# Patient Record
Sex: Male | Born: 1990 | Race: White | Hispanic: No | Marital: Married | Smoking: Current some day smoker
Health system: Southern US, Community
[De-identification: ages and names within clinical notes are randomized; demographics above are authoritative.]

---
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
## 2012-03-11 IMAGING — CT CT HEAD WITHOUT CONTRAST
2 series · 16 of 30 positions shown, 20 images · non-contrast
Comparison: none

REASON FOR EXAM: fall dizzy
COMMENTS:

[Series 2: without · axial · non-contrast · 0.43mm/px · z∈[-146,-26]mm · 13 of 30 slices shown, 17 images]
[im 3/30  brain]
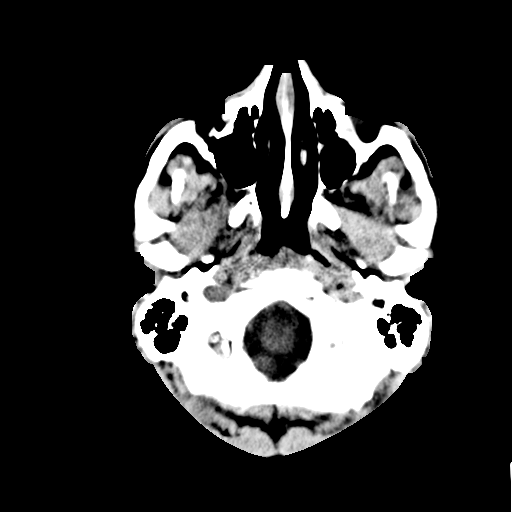
[im 3/30  bone]
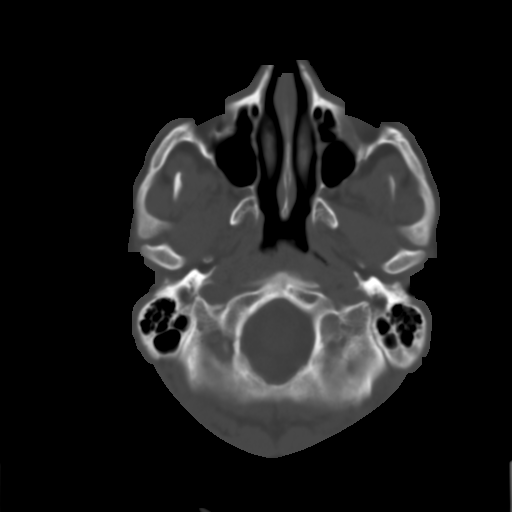
[im 5/30  brain]
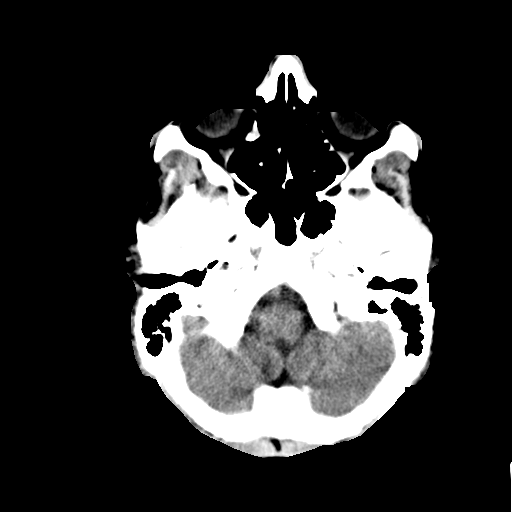
[im 7/30  brain]
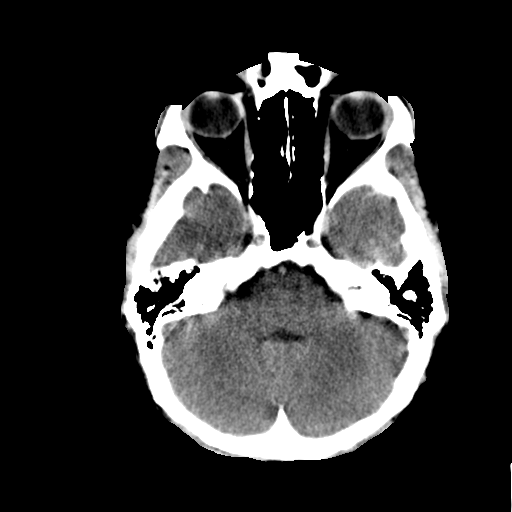
[im 9/30  brain]
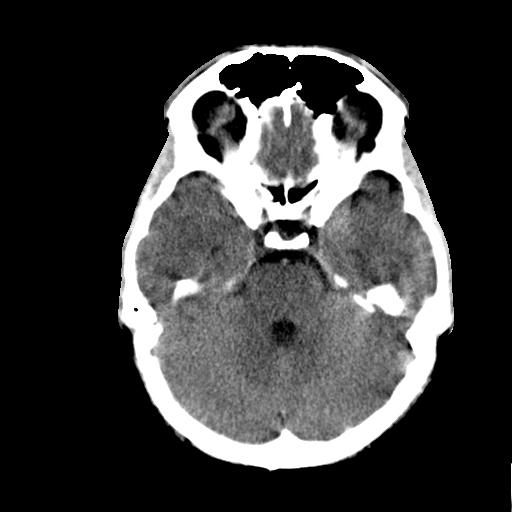
[im 11/30  brain]
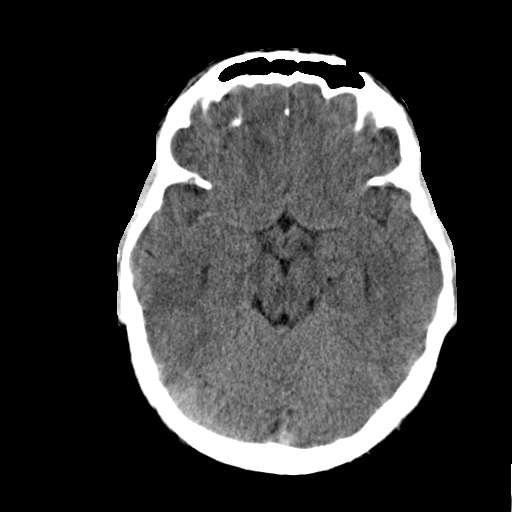
[im 11/30  bone]
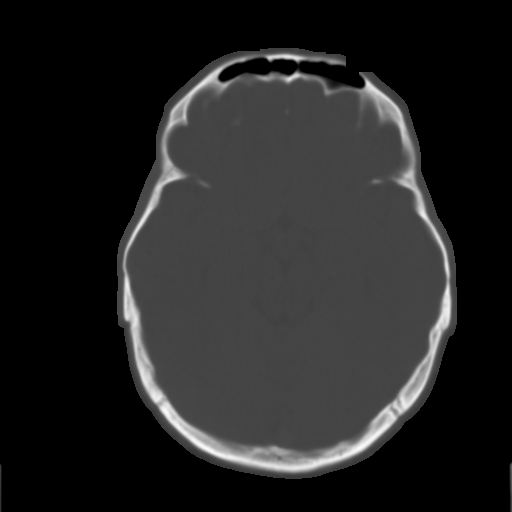
[im 13/30  brain]
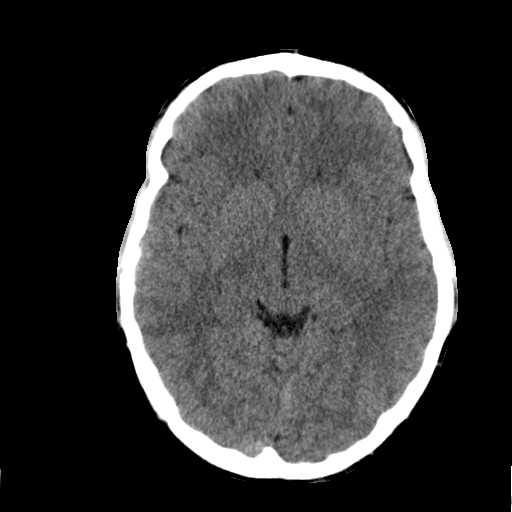
[im 15/30  brain]
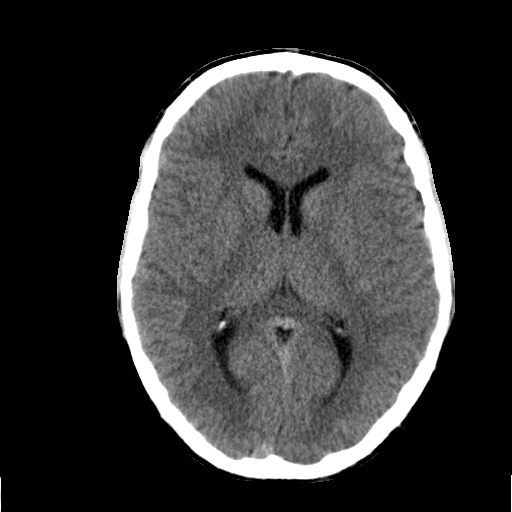
[im 17/30  brain]
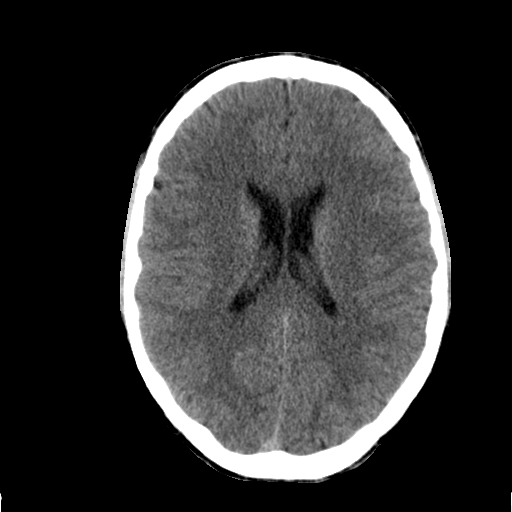
[im 19/30  brain]
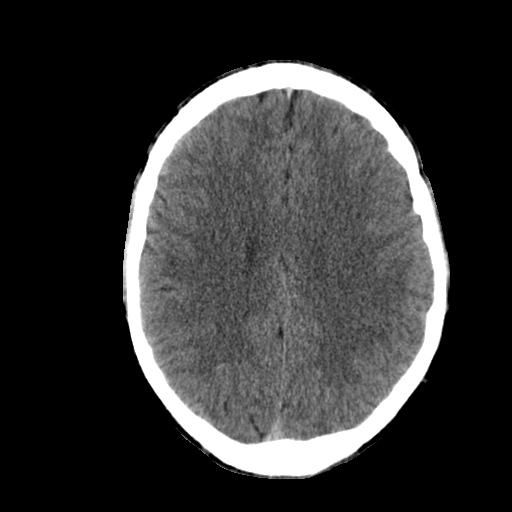
[im 19/30  bone]
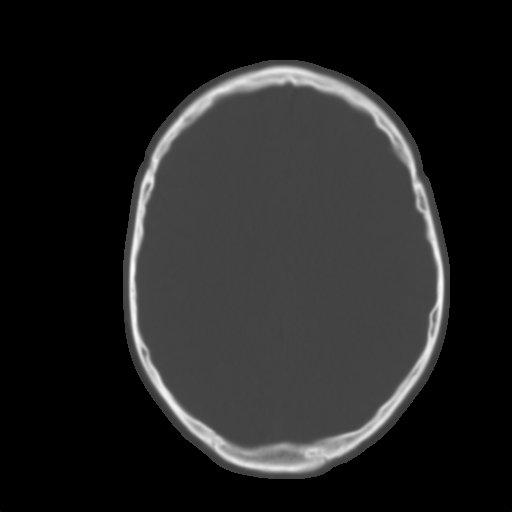
[im 21/30  brain]
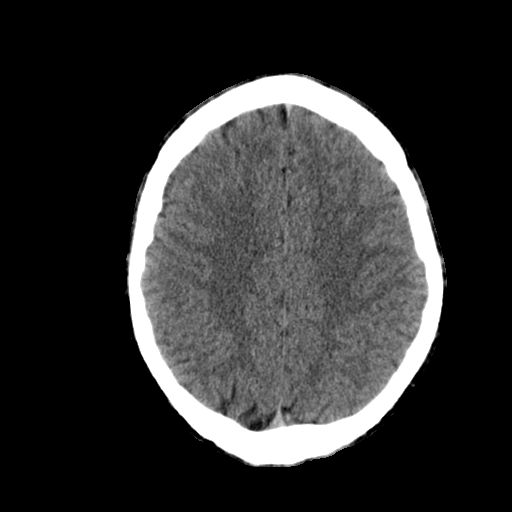
[im 23/30  brain]
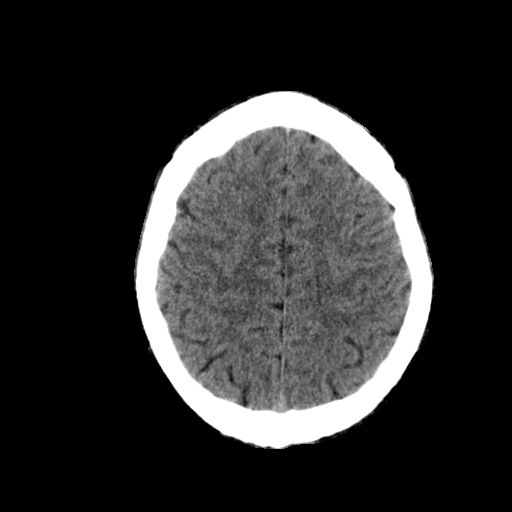
[im 25/30  brain]
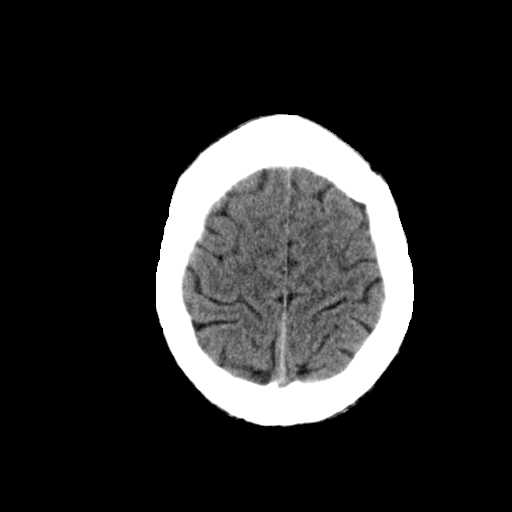
[im 27/30  brain]
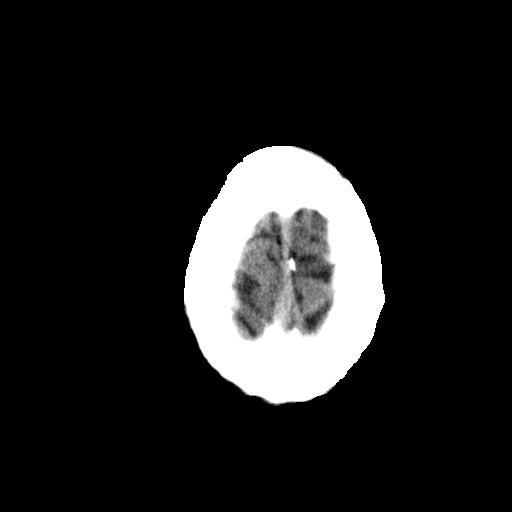
[im 27/30  bone]
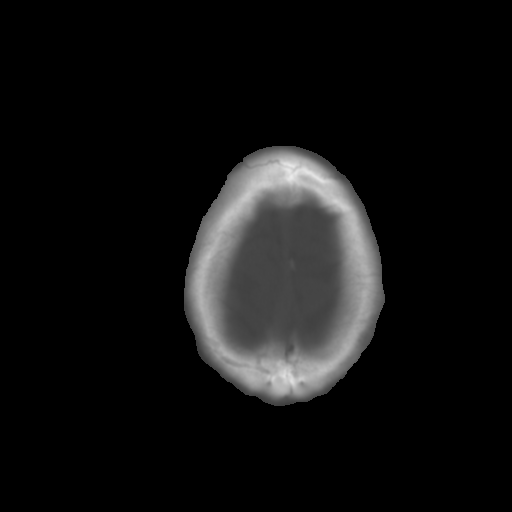

[Series 3: bone · axial · 0.43mm/px · z∈[-146,-106]mm · 3 of 30 slices shown]
[im 3/30  bone]
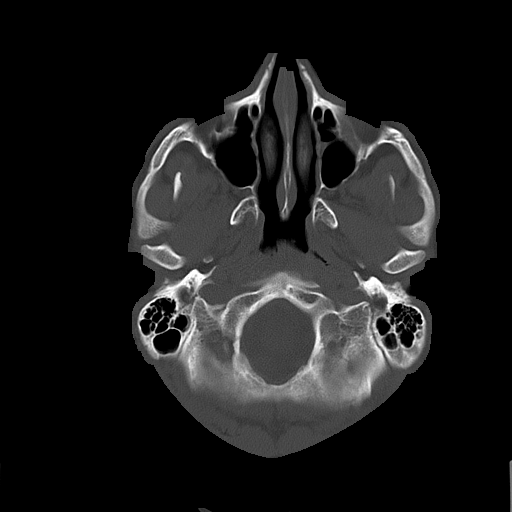
[im 7/30  bone]
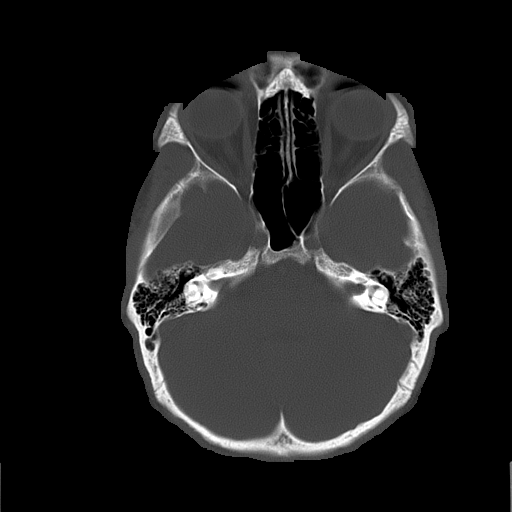
[im 11/30  bone]
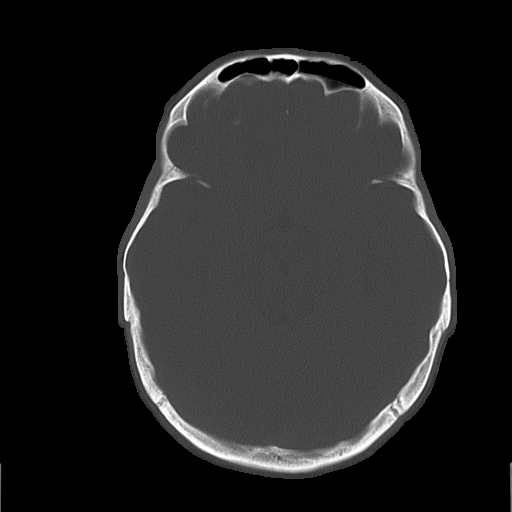

[16 of 30 positions shown; findings below may reference images not displayed]

PROCEDURE:     CT  - CT HEAD WITHOUT CONTRAST  - December 07, 2010  [DATE]

RESULT:     Axial noncontrast CT scanning was performed through the brain at
5 mm intervals and slice thicknesses.

The ventricles are normal in size and position. There is no intracranial
hemorrhage nor intracranial mass effect. There is no evidence of an evolving
ischemic infarction. The cerebellum and brainstem are normal in density. At
bone window settings the observed portions of the paranasal sinuses and
mastoid air cells are clear. There is no evidence of an acute skull fracture.
IMPRESSION: I see no acute intracranial abnormality.

## 2012-03-25 ENCOUNTER — Emergency Department: Payer: Self-pay | Admitting: Emergency Medicine

## 2014-10-19 ENCOUNTER — Encounter: Payer: Self-pay | Admitting: *Deleted

## 2014-10-19 ENCOUNTER — Emergency Department
Admission: EM | Admit: 2014-10-19 | Discharge: 2014-10-19 | Discharge status: Against Medical Advice | Payer: Self-pay | Attending: Emergency Medicine | Admitting: Emergency Medicine

## 2014-10-19 ENCOUNTER — Other Ambulatory Visit: Payer: Self-pay

## 2014-10-19 DIAGNOSIS — Z720 Tobacco use: Secondary | ICD-10-CM | POA: Insufficient documentation

## 2014-10-19 DIAGNOSIS — R0789 Other chest pain: Secondary | ICD-10-CM | POA: Insufficient documentation

## 2014-10-19 LAB — BASIC METABOLIC PANEL
ANION GAP: 10 (ref 5–15)
BUN: 19 mg/dL (ref 6–20)
CHLORIDE: 101 mmol/L (ref 101–111)
CO2: 27 mmol/L (ref 22–32)
CREATININE: 1.17 mg/dL (ref 0.61–1.24)
Calcium: 9.6 mg/dL (ref 8.9–10.3)
GFR calc Af Amer: >60 mL/min (ref >60)
GFR calc non Af Amer: >60 mL/min (ref >60)
Glucose, Bld: 97 mg/dL (ref 65–99)
Potassium: 4.2 mmol/L (ref 3.5–5.1)
Sodium: 138 mmol/L (ref 135–145)

## 2014-10-19 LAB — CBC
HCT: 50 % (ref 40.0–52.0)
Hemoglobin: 16.3 g/dL (ref 13.0–18.0)
MCH: 27.6 pg (ref 26.0–34.0)
MCHC: 32.6 g/dL (ref 32.0–36.0)
MCV: 84.7 fL (ref 80.0–100.0)
Platelets: 214 10*3/uL (ref 150–440)
RBC: 5.91 MIL/uL — ABNORMAL HIGH (ref 4.40–5.90)
RDW: 14.2 % (ref 11.5–14.5)
WBC: 9.1 10*3/uL (ref 3.8–10.6)

## 2014-10-19 LAB — TROPONIN I: Troponin I: <0.03 ng/mL (ref <0.031)

## 2014-10-19 NOTE — ED Notes (Signed)
Pt reports central chest pain for the last week , pt reports not eating much

## 2014-10-20 ENCOUNTER — Telehealth: Payer: Self-pay | Admitting: Emergency Medicine

## 2014-10-20 NOTE — ED Notes (Signed)
Called patient due to lwot to inquire about condition and follow up plans. Pt says he left here and went to [facility] and was evaluated there.

## 2019-07-27 ENCOUNTER — Other Ambulatory Visit: Payer: Self-pay

## 2019-07-27 ENCOUNTER — Emergency Department
Admission: EM | Admit: 2019-07-27 | Discharge: 2019-07-27 | Disposition: A | Discharge status: Home / Self Care | Payer: Self-pay | Attending: Emergency Medicine | Admitting: Emergency Medicine

## 2019-07-27 DIAGNOSIS — Z202 Contact with and (suspected) exposure to infections with a predominantly sexual mode of transmission: Secondary | ICD-10-CM | POA: Insufficient documentation

## 2019-07-27 DIAGNOSIS — F17290 Nicotine dependence, other tobacco product, uncomplicated: Secondary | ICD-10-CM | POA: Insufficient documentation

## 2019-07-27 MED ORDER — AZITHROMYCIN 500 MG PO TABS: ORAL_TABLET | ORAL | 0 refills | Status: AC

## 2019-07-27 MED ORDER — AZITHROMYCIN 500 MG PO TABS: ORAL_TABLET | ORAL | 0 refills | Status: DC

## 2019-07-27 NOTE — ED Notes (Signed)
See triage note  Presents for treatment for STD  States wife was tested positive for Chlamydia  Denies any sx's

## 2019-07-27 NOTE — Discharge Instructions (Signed)
Pick up your medication at [facility] on [address] and take both tablets at the same time.  Make sure that you eat prior to taking the medication as it could cause some stomach upset.

## 2019-07-27 NOTE — ED Provider Notes (Signed)
[facility] Emergency Department Provider Note   ____________________________________________   First MD Initiated Contact with Patient 07/27/19 0815     (approximate)  I have reviewed the triage vital signs and the nursing notes.   HISTORY  Chief Complaint Exposure to STD   HPI Jose Hudson is a 29 y.o. male presents to the ED for treatment of an STD.  Patient is wife was seen in the ED yesterday by myself and tested positive for chlamydia.  Patient states that he has had no discharge or dysuria.      History reviewed. No pertinent past medical history.  There are no problems to display for this patient.   History reviewed. No pertinent surgical history.  Prior to Admission medications   Medication Sig Start Date End Date Taking? Authorizing Provider  azithromycin (ZITHROMAX) 500 MG tablet Take 2 tablets once 07/27/19 08/01/19  Tommi Rumps, PA-C    Allergies Patient has no known allergies.  No family history on file.  Social History Social History   Tobacco Use  . Smoking status: Current Some Day Smoker  . Smokeless tobacco: Current User  Substance Use Topics  . Alcohol use: Yes  . Drug use: Not on file    Review of Systems Constitutional: No fever/chills Eyes: No visual changes. ENT: No sore throat. Cardiovascular: Denies chest pain. Respiratory: Denies shortness of breath. Gastrointestinal: No abdominal pain.  No nausea, no vomiting.  Genitourinary: Negative for dysuria.  Negative for penile discharge. Musculoskeletal: Negative for muscle aches. Skin: Negative for rash. Neurological: Negative for headaches, focal weakness or numbness. ___________________________________________   PHYSICAL EXAM:  VITAL SIGNS: ED Triage Vitals  Enc Vitals Group     BP 07/27/19 0735 138/85     Pulse Rate 07/27/19 0735 80     Resp 07/27/19 0735 18     Temp 07/27/19 0735 97.9 F (36.6 C)     Temp Source 07/27/19 0735 Oral     SpO2  07/27/19 0735 99 %     Weight 07/27/19 0735 180 lb (81.6 kg)     Height 07/27/19 0735 5\' 8"  (1.727 m)     Head Circumference --      Peak Flow --      Pain Score 07/27/19 0757 0     Pain Loc --      Pain Edu? --      Excl. in GC? --    Constitutional: Alert and oriented. Well appearing and in no acute distress. Eyes: Conjunctivae are normal.  Head: Atraumatic. Neck: No stridor.   Cardiovascular: Normal rate, regular rhythm. Grossly normal heart sounds.  Good peripheral circulation. Respiratory: Normal respiratory effort.  No retractions. Lungs CTAB. Musculoskeletal: Moves upper and lower extremities with any difficulty.  Normal gait was noted. Neurologic:  Normal speech and language. No gross focal neurologic deficits are appreciated. No gait instability. Skin:  Skin is warm, dry and intact.  Psychiatric: Mood and affect are normal. Speech and behavior are normal.  ____________________________________________   LABS (all labs ordered are listed, but only abnormal results are displayed)  Labs Reviewed - No data to display   PROCEDURES  Procedure(s) performed (including Critical Care):  Procedures   ____________________________________________   INITIAL IMPRESSION / ASSESSMENT AND PLAN / ED COURSE  As part of my medical decision making, I reviewed the following data within the electronic MEDICAL RECORD NUMBER Notes from prior ED visits and [place] Controlled Substance Database   29 year old male presents to the  ED for treatment of chlamydia as his wife was seen yesterday and her test came back positive.  Patient denies any penile discharge or dysuria.  Patient was given a prescription for Zithromax 500 mg 2 tablets 1 dose.  He was also made aware if there is any continued problems that he can be seen at the health department.  ____________________________________________   FINAL CLINICAL IMPRESSION(S) / ED DIAGNOSES  Final diagnoses:  Exposure to chlamydia     ED Discharge  Orders         Ordered    azithromycin (ZITHROMAX) 500 MG tablet  Status:  Discontinued     07/27/19 0835    azithromycin (ZITHROMAX) 500 MG tablet    Note to Pharmacy: Disregard initial prescription as the instructions are incorrect.   07/27/19 0836           Note:  This document was prepared using Dragon voice recognition software and may include unintentional dictation errors.    Johnn Hai, PA-C 07/27/19 0037    Earleen Newport, MD 07/27/19 [phone]

## 2019-07-27 NOTE — ED Triage Notes (Signed)
Pt states his wife tested positive for chlamydia yesterday and needs to be tested. Pt denies any sx or penile discharge.

## 2020-09-12 ENCOUNTER — Other Ambulatory Visit: Payer: Self-pay

## 2020-09-12 ENCOUNTER — Emergency Department
Admission: EM | Admit: 2020-09-12 | Discharge: 2020-09-12 | Disposition: A | Discharge status: Home / Self Care | Payer: Self-pay | Attending: Emergency Medicine | Admitting: Emergency Medicine

## 2020-09-12 DIAGNOSIS — Y990 Civilian activity done for income or pay: Secondary | ICD-10-CM | POA: Insufficient documentation

## 2020-09-12 DIAGNOSIS — X500XXA Overexertion from strenuous movement or load, initial encounter: Secondary | ICD-10-CM | POA: Insufficient documentation

## 2020-09-12 DIAGNOSIS — M5412 Radiculopathy, cervical region: Secondary | ICD-10-CM | POA: Insufficient documentation

## 2020-09-12 DIAGNOSIS — S39012A Strain of muscle, fascia and tendon of lower back, initial encounter: Secondary | ICD-10-CM | POA: Insufficient documentation

## 2020-09-12 DIAGNOSIS — F17200 Nicotine dependence, unspecified, uncomplicated: Secondary | ICD-10-CM | POA: Insufficient documentation

## 2020-09-12 MED ORDER — MELOXICAM 7.5 MG PO TABS
15.0000 mg | ORAL_TABLET | Freq: Once | ORAL | Status: AC
  Administered 2020-09-12: 15 mg via ORAL
  Filled 2020-09-12: qty 2

## 2020-09-12 MED ORDER — METHOCARBAMOL 500 MG PO TABS
1000.0000 mg | ORAL_TABLET | Freq: Once | ORAL | Status: AC
  Administered 2020-09-12: 1000 mg via ORAL
  Filled 2020-09-12: qty 2

## 2020-09-12 MED ORDER — METHOCARBAMOL 500 MG PO TABS: 500.0000 mg | ORAL_TABLET | Freq: Four times a day (QID) | ORAL | 0 refills | Status: AC

## 2020-09-12 MED ORDER — MELOXICAM 15 MG PO TABS: 15.0000 mg | ORAL_TABLET | Freq: Every day | ORAL | 0 refills | Status: AC

## 2020-09-12 NOTE — ED Provider Notes (Signed)
[facility] Emergency Department Provider Note  ____________________________________________  Time seen: Approximately 8:50 PM  I have reviewed the triage vital signs and the nursing notes.   HISTORY  Chief Complaint Headache, Back Pain, and Shoulder Pain    HPI Jose Hudson is a 30 y.o. male who presented to the emergency department complaining of lower back pain times several days as well as some pain in his neck rating down his arm earlier today.  Patient states that for several days has had some lower back pain.  He does heavy lifting and repetitive bending and twisting at his work.  Today patient was loading a forklift, states that after doing so he was walking to his truck and as he pulled on his truck door he had a numb/shooting sensation down his arm.  He states that this has resolved.  He had had a headache at the same time that he had the sensation in his arm but that has also resolved.  Patient is still complaining of some lower back pain at this time.  No radiation into the legs.  He has had no bowel or bladder dysfunction, saddle anesthesia or paresthesias.  No chronic issues with his neck or his back.  He not take any medications for his complaint prior to arrival.         History reviewed. No pertinent past medical history.  There are no problems to display for this patient.   History reviewed. No pertinent surgical history.  Prior to Admission medications   Medication Sig Start Date End Date Taking? Authorizing Provider  meloxicam (MOBIC) 15 MG tablet Take 1 tablet (15 mg total) by mouth daily. 09/12/20  Yes Daenerys Buttram, Delorise Royals, PA-C  methocarbamol (ROBAXIN) 500 MG tablet Take 1 tablet (500 mg total) by mouth 4 (four) times daily. 09/12/20  Yes Haili Donofrio, Delorise Royals, PA-C    Allergies Patient has no known allergies.  No family history on file.  Social History Social History   Tobacco Use  . Smoking status: Current Some Day Smoker   . Smokeless tobacco: Current User  Substance Use Topics  . Alcohol use: Yes    Alcohol/week: 2.0 standard drinks    Types: 2 Shots of liquor per week    Comment: most nights  . Drug use: Never     Review of Systems  Constitutional: No fever/chills Eyes: No visual changes. No discharge ENT: No upper respiratory complaints. Cardiovascular: no chest pain. Respiratory: no cough. No SOB. Gastrointestinal: No abdominal pain.  No nausea, no vomiting.  No diarrhea.  No constipation. Genitourinary: Negative for dysuria. No hematuria Musculoskeletal: Positive for lower back pain.  Patient with positive neck pain running down the arm with some numbness and tingling. Skin: Negative for rash, abrasions, lacerations, ecchymosis. Neurological: Negative for headaches, focal weakness or numbness.  10 System ROS otherwise negative.  ____________________________________________   PHYSICAL EXAM:  VITAL SIGNS: ED Triage Vitals  Enc Vitals Group     BP 09/12/20 1827 139/83     Pulse Rate 09/12/20 1827 69     Resp 09/12/20 1827 18     Temp 09/12/20 1827 98.2 F (36.8 C)     Temp Source 09/12/20 1827 Oral     SpO2 09/12/20 1827 98 %     Weight --      Height --      Head Circumference --      Peak Flow --      Pain Score 09/12/20 1825 8  Pain Loc --      Pain Edu? --      Excl. in GC? --      Constitutional: Alert and oriented. Well appearing and in no acute distress. Eyes: Conjunctivae are normal. PERRL. EOMI. Head: Atraumatic. ENT:      Ears:       Nose: No congestion/rhinnorhea.      Mouth/Throat: Mucous membranes are moist.  Neck: No stridor.  No cervical spine tenderness to palpation.  Midline the left side.  Mild tenderness in the right paraspinal muscle group extending along the superior aspect of the shoulder.  No palpable abnormalities.  Radial pulses sensation intact and equal bilateral upper extremities.  Cardiovascular: Normal rate, regular rhythm. Normal S1 and S2.   Good peripheral circulation. Respiratory: Normal respiratory effort without tachypnea or retractions. Lungs CTAB. Good air entry to the bases with no decreased or absent breath sounds. Musculoskeletal: Full range of motion to all extremities. No gross deformities appreciated.  Visualization of lumbar spine reveals no visible signs of trauma.  Nontender to palpation midline and left paraspinal muscle group but diffuse tenderness along the right paraspinal muscle group extending from the lower thoracic region and through the lumbar spine.  Palpable spasm on the right side as well.  No extension into the SI joint or sciatic notch.  Negative straight leg raise bilaterally.  Dorsalis pedis pulse and sensation intact and equal bilateral lower extremities. Neurologic:  Normal speech and language. No gross focal neurologic deficits are appreciated.  Cranial nerves II through XII grossly intact.  Negative Romberg's and pronator drift.  Equal grip strength bilateral upper extremities. Skin:  Skin is warm, dry and intact. No rash noted. Psychiatric: Mood and affect are normal. Speech and behavior are normal. Patient exhibits appropriate insight and judgement.   ____________________________________________   LABS (all labs ordered are listed, but only abnormal results are displayed)  Labs Reviewed - No data to display ____________________________________________  EKG   ____________________________________________  RADIOLOGY   No results found.  ____________________________________________    PROCEDURES  Procedure(s) performed:    Procedures    Medications  meloxicam (MOBIC) tablet 15 mg (has no administration in time range)  methocarbamol (ROBAXIN) tablet 1,000 mg (has no administration in time range)     ____________________________________________   INITIAL IMPRESSION / ASSESSMENT AND PLAN / ED COURSE  Pertinent labs & imaging results that were available during my care of the  patient were reviewed by me and considered in my medical decision making (see chart for details).  Review of the  [place] was performed in accordance of the [facility] prior to dispensing any controlled drugs.           Patient's diagnosis is consistent with cervical radiculopathy.  Patient presented to the emergency department complaining of right arm numbness and neck pain.  Patient had been lifting heavy items at work, as he was going to his truck he pulled on the handle and had a sensation shoot down his arm.  No direct trauma.  Patient is neurologically intact at this time.  Patient has had some ongoing lower back issues this week which appear to be spasms on physical exam.  Feel the patient had a brief period of cervical radiculopathy giving him the symptoms and is currently resolved.  At this time patient will have anti-inflammatory muscle relaxer for symptom control.  Follow-up with primary care or orthopedics if needed.. Patient is given ED precautions to return to the ED for any worsening or new  symptoms.     ____________________________________________  FINAL CLINICAL IMPRESSION(S) / ED DIAGNOSES  Final diagnoses:  Cervical radiculopathy  Strain of lumbar region, initial encounter      NEW MEDICATIONS STARTED DURING THIS VISIT:  ED Discharge Orders         Ordered    meloxicam (MOBIC) 15 MG tablet  Daily        09/12/20 2138    methocarbamol (ROBAXIN) 500 MG tablet  4 times daily        09/12/20 2138              This chart was dictated using voice recognition software/Dragon. Despite best efforts to proofread, errors can occur which can change the meaning. Any change was purely unintentional.    Racheal Patches, PA-C 09/12/20 2147    Dionne Bucy, MD 09/12/20 [phone]

## 2020-09-12 NOTE — ED Triage Notes (Signed)
Pt comes with c/o right shoulder, back and body aches for few days. Pt states headache.
